# Patient Record
Sex: Male | Born: 1995 | Race: Black or African American | Hispanic: No | Marital: Single
Health system: Midwestern US, Community
[De-identification: ages and names within clinical notes are randomized; demographics above are authoritative.]

## PROBLEM LIST (undated history)

## (undated) DIAGNOSIS — S62307A Unspecified fracture of fifth metacarpal bone, left hand, initial encounter for closed fracture: Secondary | ICD-10-CM

## (undated) DIAGNOSIS — S62327D Displaced fracture of shaft of fifth metacarpal bone, left hand, subsequent encounter for fracture with routine healing: Secondary | ICD-10-CM

---
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
## 2015-06-18 IMAGING — CT CT HEAD W/O CM
1 series · 16 of 30 positions shown, 20 images · non-contrast
Comparison: None.

CLINICAL DATA: Diffuse headache

EXAM:
CT HEAD WITHOUT CONTRAST
TECHNIQUE: Contiguous axial images were obtained from the base of the skull
through the vertex without intravenous contrast.

[Series 2: head 5.0 h30s · axial · 0.45mm/px · z∈[-58,+87]mm · 16 of 33 slices shown, 20 images]
[im 2/33  brain]
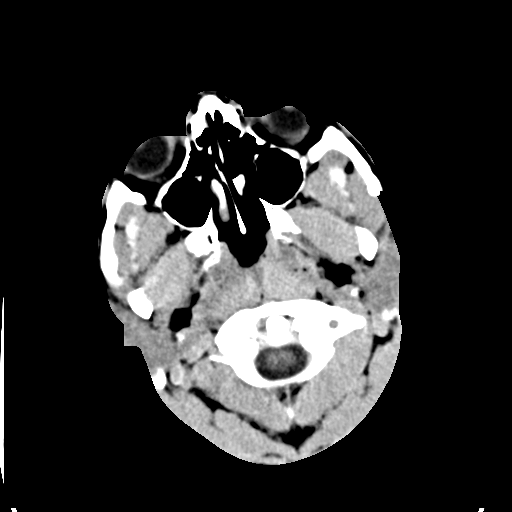
[im 2/33  bone]
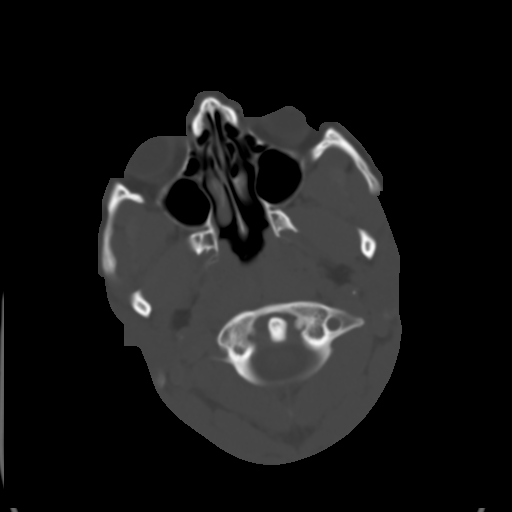
[im 4/33  brain]
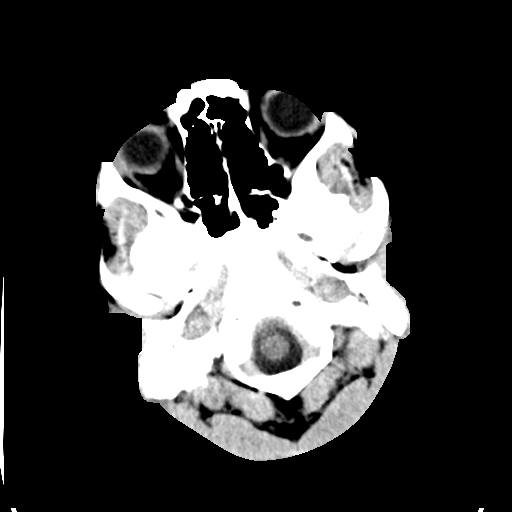
[im 6/33  brain]
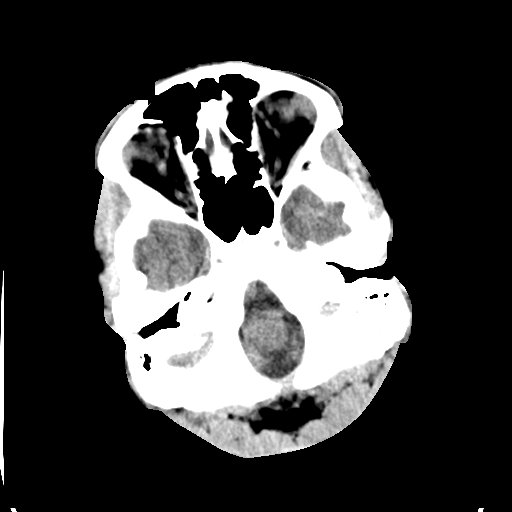
[im 8/33  brain]
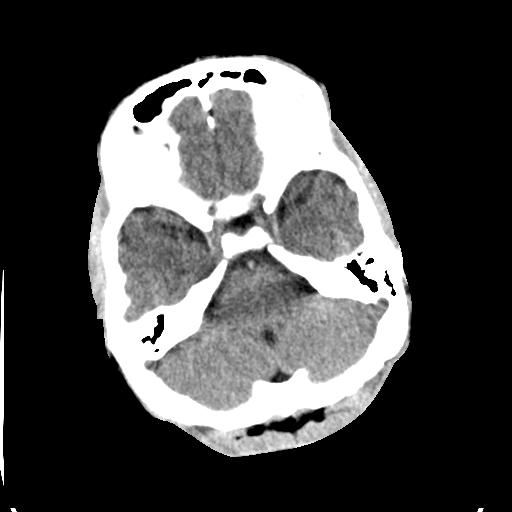
[im 9/33  brain]
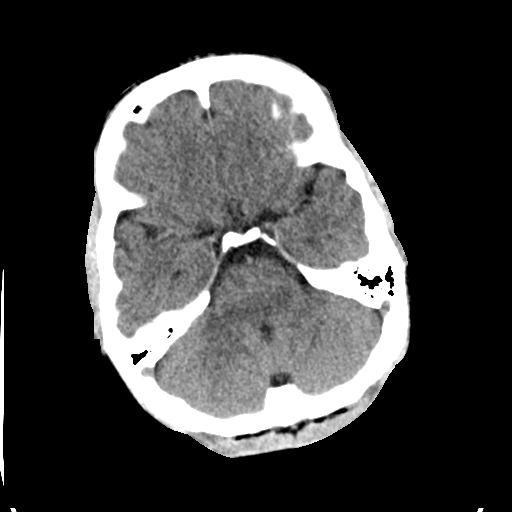
[im 9/33  bone]
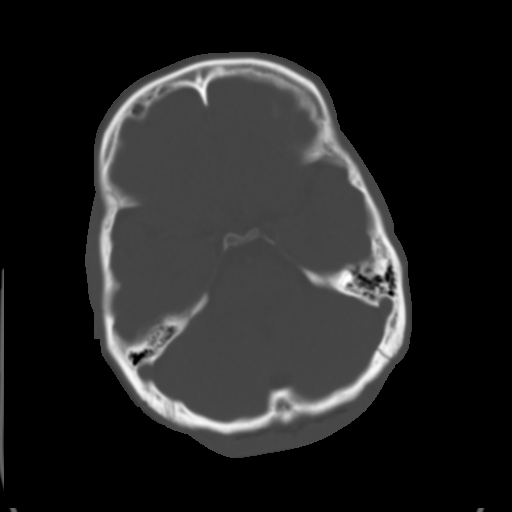
[im 12/33  brain]
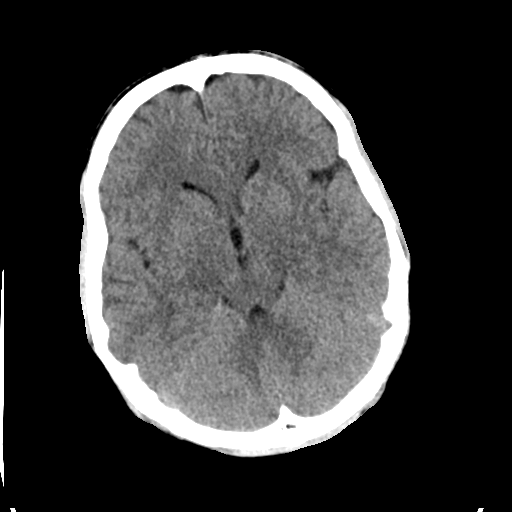
[im 14/33  brain]
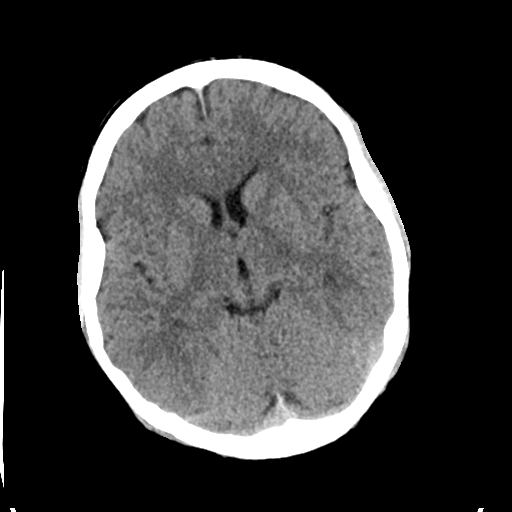
[im 16/33  brain]
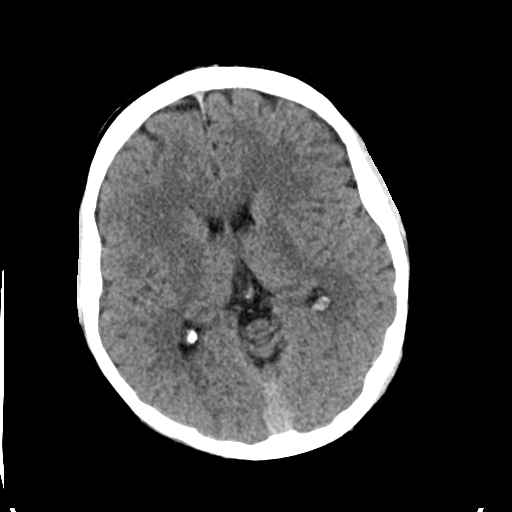
[im 17/33  brain]
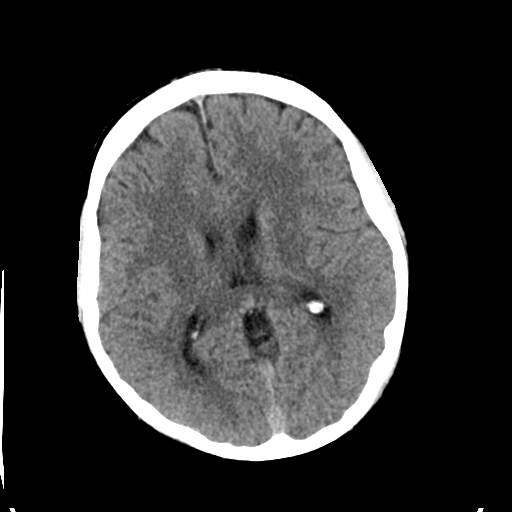
[im 17/33  bone]
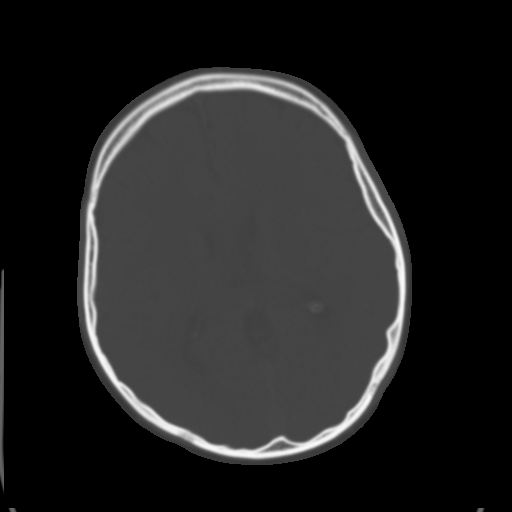
[im 19/33  brain]
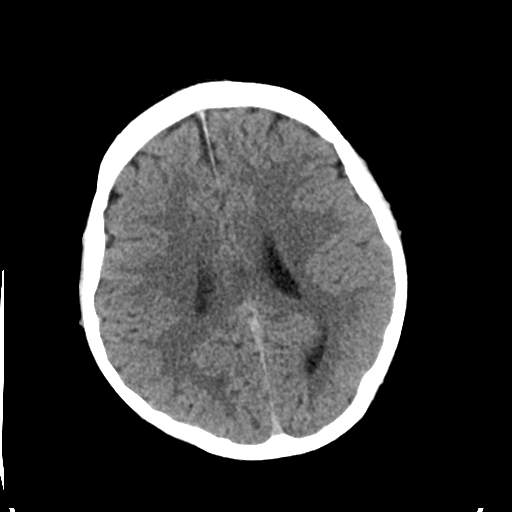
[im 21/33  brain]
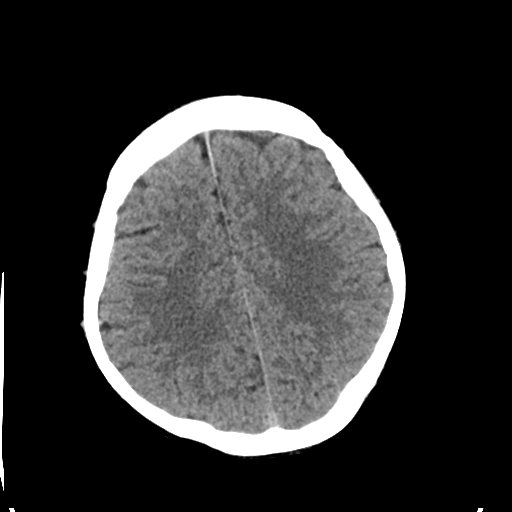
[im 24/33  brain]
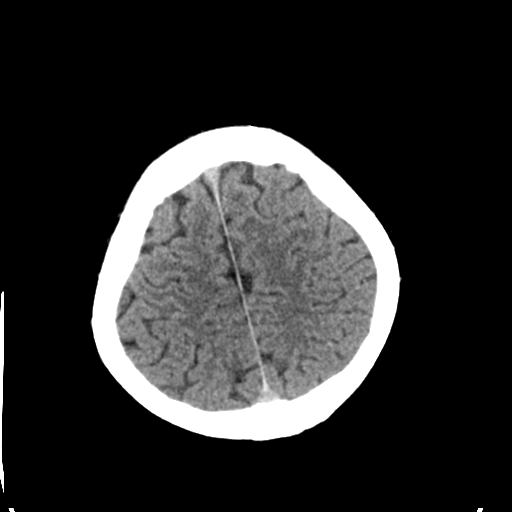
[im 25/33  brain]
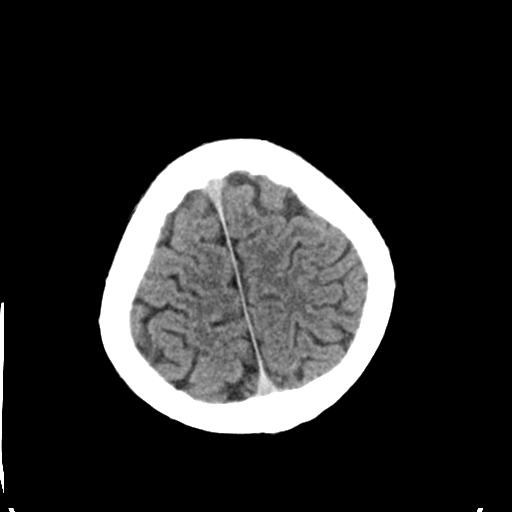
[im 25/33  bone]
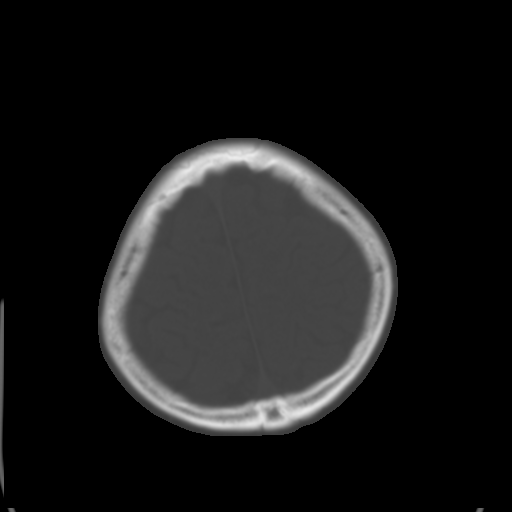
[im 27/33  brain]
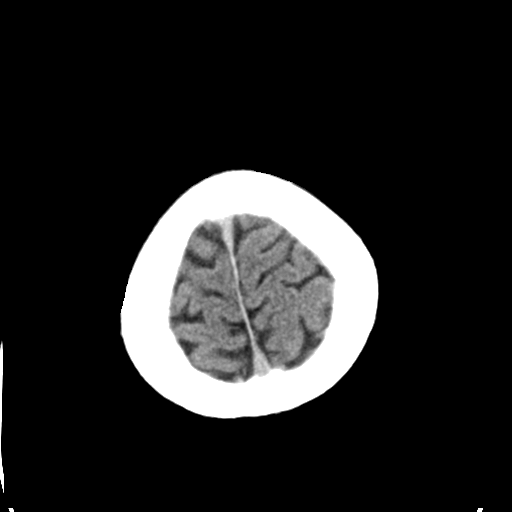
[im 29/33  brain]
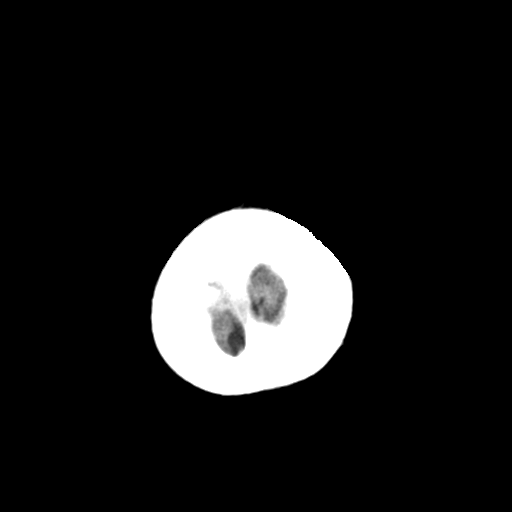
[im 31/33  brain]
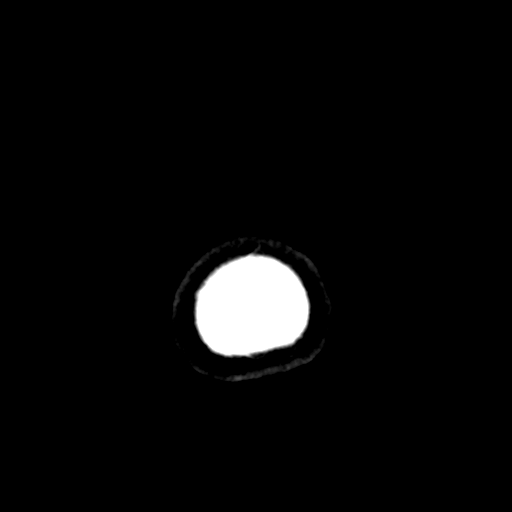

[16 of 30 positions shown; findings below may reference images not displayed]

FINDINGS: No evidence of parenchymal hemorrhage or extra-axial fluid
collection. No mass lesion, mass effect, or midline shift.

No CT evidence of acute infarction.

Cerebral volume is within normal limits.  No ventriculomegaly.

The visualized paranasal sinuses are essentially clear. The mastoid
air cells are unopacified.

No evidence of calvarial fracture.
IMPRESSION: Normal head CT.

## 2015-11-21 NOTE — Nursing Note (Signed)
Nursing Discharge Summary - Text       Nursing Discharge Summary Entered On:  11/21/2015 20:58 EST    Performed On:  11/21/2015 20:57 EST by Barrett, RN, Tresa Endo               DC Information   Discharge To, Anticipated :   Home independently   Devices/Equipment :   Crutches Axillary   Mode of Discharge :   Ambulatory   Transportation :   Private vehicle   Barrett, RNTresa Endo - 11/21/2015 20:57 EST

## 2021-11-30 ENCOUNTER — Emergency Department: Admit: 2021-11-30 | Payer: BLUE CROSS/BLUE SHIELD

## 2021-11-30 ENCOUNTER — Inpatient Hospital Stay
Admit: 2021-11-30 | Discharge: 2021-11-30 | Disposition: A | Discharge status: Home / Self Care | Payer: BLUE CROSS/BLUE SHIELD

## 2021-11-30 DIAGNOSIS — S62339B Displaced fracture of neck of unspecified metacarpal bone, initial encounter for open fracture: Secondary | ICD-10-CM

## 2021-11-30 DIAGNOSIS — S6291XB Unspecified fracture of right wrist and hand, initial encounter for open fracture: Secondary | ICD-10-CM

## 2021-11-30 MED ORDER — LIDOCAINE HCL 1 % IJ SOLN
1 % | INTRAMUSCULAR | Status: AC
Start: 2021-11-30 — End: 2021-11-30
  Administered 2021-11-30: 22:00:00 via INTRADERMAL

## 2021-11-30 MED ORDER — CEPHALEXIN 250 MG PO CAPS
250 MG | ORAL | Status: AC
Start: 2021-11-30 — End: 2021-11-30
  Administered 2021-11-30: 22:00:00 500 via ORAL

## 2021-11-30 MED ORDER — TETANUS-DIPHTH-ACELL PERTUSSIS 5-2.5-18.5 LF-MCG/0.5 IM SUSY
Freq: Once | INTRAMUSCULAR | Status: AC
Start: 2021-11-30 — End: 2021-11-30
  Administered 2021-11-30: 21:00:00 0.5 mL via INTRAMUSCULAR

## 2021-11-30 MED ORDER — CEPHALEXIN 250 MG PO CAPS
250 MG | Freq: Once | ORAL | Status: AC
Start: 2021-11-30 — End: 2021-11-30

## 2021-11-30 MED ORDER — CEPHALEXIN 500 MG PO CAPS
500 MG | ORAL_CAPSULE | Freq: Four times a day (QID) | ORAL | 0 refills | Status: AC
Start: 2021-11-30 — End: 2021-12-07

## 2021-11-30 MED ORDER — HYDROCODONE-ACETAMINOPHEN 5-325 MG PO TABS
5-325 MG | ORAL | Status: AC
Start: 2021-11-30 — End: 2021-11-30
  Administered 2021-11-30: 21:00:00 1 via ORAL

## 2021-11-30 MED ORDER — LIDOCAINE HCL 1 % IJ SOLN
1 % | Freq: Once | INTRAMUSCULAR | Status: AC
Start: 2021-11-30 — End: 2021-11-30

## 2021-11-30 MED FILL — BOOSTRIX 5-2.5-18.5 LF-MCG/0.5 IM SUSY: INTRAMUSCULAR | Qty: 0.5

## 2021-11-30 MED FILL — HYDROCODONE-ACETAMINOPHEN 5-325 MG PO TABS: 5-325 MG | ORAL | Qty: 1

## 2021-11-30 MED FILL — CEPHALEXIN 250 MG PO CAPS: 250 MG | ORAL | Qty: 2

## 2021-11-30 MED FILL — LIDOCAINE HCL 1 % IJ SOLN: 1 % | INTRAMUSCULAR | Qty: 20

## 2021-11-30 NOTE — Discharge Instructions (Addendum)
Take Keflex as prescribed  Wear splint until outpatient follow-up with hand surgeon  Alternate Tylenol and Motrin for pain control  Follow-up with Dr. Cyndia Diver on Wednesday.  Call today for appointment time.  Return to the emergency department immediately for any new or worsening symptoms

## 2021-11-30 NOTE — ED Provider Notes (Signed)
[facility] EMERGENCY DEPT  EMERGENCY DEPARTMENT ENCOUNTER      Pt Name: Jeremy Cantu  MRN: [id number]  Birthdate 09/05/1996  Date of evaluation: 11/30/2021  Provider: Calla KicksAlexandra Lyn Zakyra Kukuk, PA-C    CHIEF COMPLAINT       Chief Complaint   Patient presents with    Hand Injury     C/o right hand pain after hand got shut in car door.          HISTORY OF PRESENT ILLNESS    Jeremy SmilingXavier E Kroner is a 26 y.o. male  who presents to the emergency department for evaluation after slamming hand in car door.  Has swelling to the dorsal aspect of right hand over fifth metacarpal with very small laceration.  Unknown last tetanus immunization.    Nursing Notes were reviewed.    REVIEW OF SYSTEMS     Review of Systems   Constitutional:  Negative for chills and fever.   HENT:  Negative for ear pain and sore throat.    Eyes:  Negative for pain and visual disturbance.   Respiratory:  Negative for cough and shortness of breath.    Cardiovascular:  Negative for chest pain and palpitations.   Gastrointestinal:  Negative for abdominal pain and vomiting.   Genitourinary:  Negative for dysuria and hematuria.   Musculoskeletal:  Negative for back pain.        Right hand swelling   Skin:  Positive for wound (laceration). Negative for rash.   Neurological:  Negative for dizziness and headaches.   Psychiatric/Behavioral:  Negative for behavioral problems and confusion.    All other systems reviewed and are negative.       PAST MEDICAL HISTORY   No past medical history on file.      SURGICAL HISTORY     No past surgical history on file.      CURRENT MEDICATIONS       Previous Medications    No medications on file       ALLERGIES     Patient has no known allergies.    FAMILY HISTORY     No family history on file.       SOCIAL HISTORY       Social History     Socioeconomic History    Marital status: Single         PHYSICAL EXAM       ED Triage Vitals [11/30/21 1516]   BP Temp Temp src Heart Rate Resp SpO2 Height Weight   115/83 -- -- 74 18 100 % 6\' 3"  (1.905 m)  165 lb (74.8 kg)     Physical Exam  Vitals and nursing note reviewed.   Constitutional:       Appearance: Normal appearance.   HENT:      Head: Normocephalic and atraumatic.      Nose: Nose normal.      Mouth/Throat:      Mouth: Mucous membranes are moist.   Eyes:      Conjunctiva/sclera: Conjunctivae normal.   Cardiovascular:      Rate and Rhythm: Normal rate and regular rhythm.   Pulmonary:      Effort: Pulmonary effort is normal.      Breath sounds: Normal breath sounds.   Abdominal:      Palpations: Abdomen is soft.      Tenderness: There is no abdominal tenderness.   Musculoskeletal:      Right hand: Swelling, laceration and tenderness present. Normal pulse.  Comments: Moderate swelling to the dorsal aspect of right hand over fifth metacarpal.  Approximately 0.5 cm to the distal fifth metacarpal.  Slight oozing of blood.  Tenderness over fifth metacarpal.  Range of motion slightly limited secondary to pain.  No tenderness over first through fourth metacarpal. No tenderness to fingers.  Full range of motion of fingers.  2+ radial pulse.  Brisk capillary refill.   Skin:     General: Skin is warm and dry.   Neurological:      General: No focal deficit present.      Mental Status: He is alert and oriented to person, place, and time.   Psychiatric:         Mood and Affect: Mood normal.         Behavior: Behavior normal.        DIAGNOSTIC RESULTS     RADIOLOGY:    XR HAND RIGHT (MIN 3 VIEWS)   Final Result   Mildly comminuted fracture of the distal metadiaphysis of the fifth    metacarpal. This has approximately 20 degrees of volar angulation. No    intra-articular extension noted. Mild overlying soft tissue swelling.      XR HAND RIGHT (MIN 3 VIEWS)    (Results Pending)         EMERGENCY DEPARTMENT COURSE and DIFFERENTIAL DIAGNOSIS/MDM:   Vitals:    Vitals:    11/30/21 1516 11/30/21 1533 11/30/21 1701   BP: 115/83 (!) 140/101 128/62   Pulse: 74 70 61   Resp: 18 18 18    SpO2: 100% 98% 100%   Weight: 74.8 kg      Height: 6\' 3"  (1.905 m)         MDM  Number of Diagnoses or Management Options  Boxer's fracture, open, initial encounter  Diagnosis management comments: Presents with right hand injury after punching a wall  Normal vital signs here in ED  Patient with small laceration to the dorsal aspect of right hand over injury  X-ray confirms mildly comminuted fracture of the distal metadiaphysis of the fifth metacarpal with approximately 20 degrees of volar angulation no intra-articular extension noted.  Laceration repaired.  See procedure note  Tetanus immunization updated  Reduction attempted.  Minimal improvement.  See procedure note.  Will need outpatient follow-up with hand specialist.  Ulnar gutter splint applied.  See procedure note  Stable for outpatient follow-up with hand specialist on Wednesday  We will start on prophylactic antibiotics with Keflex.  First dose given here in ED  Discussed Tylenol & Motrin for pain control  Return to ED precautions given.         Lac Repair    Date/Time: 11/30/2021 4:39 PM  Performed by: Thursday, PA-C  Authorized by: 12/02/2021, PA-C     Consent:     Consent obtained:  Verbal    Consent given by:  Patient    Risks, benefits, and alternatives were discussed: yes      Risks discussed:  Infection, pain, retained foreign body, need for additional repair, poor cosmetic result, poor wound healing, nerve damage, vascular damage and tendon damage    Alternatives discussed:  No treatment  Universal protocol:     Procedure explained and questions answered to patient or proxy's satisfaction: yes      Patient identity confirmed:  Verbally with patient  Anesthesia:     Anesthesia method:  Local infiltration    Local anesthetic:  Lidocaine 1% w/o epi  Laceration details:  Location:  Hand    Hand location:  R hand, dorsum    Length (cm):  0.5  Pre-procedure details:     Preparation:  Patient was prepped and draped in usual sterile fashion and imaging obtained to evaluate  for foreign bodies  Treatment:     Area cleansed with:  Shur-Clens    Amount of cleaning:  Standard    Irrigation solution:  Sterile saline    Irrigation volume:  250    Irrigation method:  Pressure wash  Skin repair:     Repair method:  Sutures    Suture size:  5-0    Suture material:  Prolene    Suture technique:  Simple interrupted    Number of sutures:  1  Approximation:     Approximation:  Close  Repair type:     Repair type:  Simple  Post-procedure details:     Dressing:  Adhesive bandage    Procedure completion:  Tolerated well, no immediate complications  Splint Application    Date/Time: 11/30/2021 4:50 PM  Performed by: Calla Kicks, PA-C  Authorized by: Calla Kicks, PA-C     Consent:     Consent obtained:  Verbal    Consent given by:  Patient    Risks, benefits, and alternatives were discussed: yes      Risks discussed:  Discoloration, pain and swelling    Alternatives discussed:  No treatment  Universal protocol:     Procedure explained and questions answered to patient or proxy's satisfaction: yes      Imaging studies available: yes      Patient identity confirmed:  Verbally with patient  Pre-procedure details:     Distal neurologic exam:  Normal    Distal perfusion: distal pulses strong and brisk capillary refill    Procedure details:     Location:  Hand    Hand location:  R hand    Cast type:  Short arm    Splint type:  Ulnar gutter    Supplies:  Cotton padding, fiberglass and elastic bandage  Post-procedure details:     Distal neurologic exam:  Normal    Distal perfusion: distal pulses strong      Procedure completion:  Tolerated well, no immediate complications    Post-procedure imaging: reviewed    Ortho Injury    Date/Time: 11/30/2021 5:15 PM  Performed by: Calla Kicks, PA-C  Authorized by: Calla Kicks, PA-C   Consent: Verbal consent obtained.  Consent given by: patient  Patient understanding: patient states understanding of the procedure being performed  Patient  consent: the patient's understanding of the procedure matches consent given  Relevant documents: relevant documents present and verified  Test results: test results available and properly labeled  Site marked: the operative site was marked  Imaging studies: imaging studies available  Patient identity confirmed: verbally with patient  Injury location: hand  Location details: right hand  Injury type: fracture  Fracture type: fifth metacarpal  Pre-procedure neurovascular assessment: neurovascularly intact  Pre-procedure distal perfusion: normal  Pre-procedure neurological function: normal  Pre-procedure range of motion: normal  Anesthesia: local infiltration    Anesthesia:  Local anesthesia used: yes  Local Anesthetic: lidocaine 1% with epinephrine    Sedation:  Patient sedated: no    Manipulation performed: yes  Skin traction used: yes  Skeletal traction used: no  Immobilization: splint  Splint type: ulnar gutter  Supplies used: Ortho-Glass  Post-procedure neurovascular assessment: post-procedure neurovascularly intact  Post-procedure distal  perfusion: normal  Post-procedure neurological function: normal  Post-procedure range of motion: normal  Patient tolerance: patient tolerated the procedure well with no immediate complications  Comments: Minimal reduction on xray-- will need outpatient f/u with hand surgery        REASSESSMENT     ED Course as of 11/30/21 1714   Mon Nov 30, 2021   1610 Discussed case with Dr. Cyndia Diver on-call hand surgeon who recommends local numbing, cleaning with Hibiclens, closure of laceration, prophylaxis with Keflex and intrinsic positive splint.  Can see in clinic on Wednesday [AC]   1612 Once again asked patient if he punched something.  He does admit now that he punched a wall.  He states that he did not punch human.  Did discuss with him that there is a significant infection risk if patient punched a human or had contact with tooth/mouth causing laceration.  He once again denies punching  human and states that he did punch a wall. [AC]      ED Course User Index  [AC] Calla Kicks, PA-C         CONSULTS:  IP CONSULT TO HAND SURGERY-- Dr. Cyndia Diver      FINAL IMPRESSION      1. Boxer's fracture, open, initial encounter          DISPOSITION/PLAN   DISPOSITION Decision To Discharge 11/30/2021 04:51:35 PM      PATIENT REFERRED TO:  Wyline Mood, MD  [address]  [place] [address]  [phone]    Schedule an appointment as soon as possible for a visit       DISCHARGE MEDICATIONS:  New Prescriptions    CEPHALEXIN (KEFLEX) 500 MG CAPSULE    Take 1 capsule by mouth 4 times daily for 7 days         (Please note that portions of this note were completed with a voice recognition program.  Efforts were made to edit the dictations but occasionally words are mis-transcribed.)    Calla Kicks, PA-C (electronically signed)  Emergency Physician Assistant           Calla Kicks, PA-C  11/30/21 1721

## 2021-12-02 NOTE — Telephone Encounter (Signed)
PATIENT SEEN AT [facility] ON 11-30-21 FOR RT HAND BOXER'S FX, X-RAYS IN CHART, HE IS REQUESTING A SOONER APPT AT THE [facility]

## 2021-12-04 ENCOUNTER — Ambulatory Visit: Admit: 2021-12-04 | Discharge: 2021-12-04 | Payer: BLUE CROSS/BLUE SHIELD | Attending: Orthopaedic Surgery

## 2021-12-04 DIAGNOSIS — S62307A Unspecified fracture of fifth metacarpal bone, left hand, initial encounter for closed fracture: Secondary | ICD-10-CM

## 2021-12-07 NOTE — Progress Notes (Signed)
Jeremy Cantu was seen today as a new patient evaluation.  This 26 year old right-hand-dominant man injured his right hand when he punched a wall several days ago.  He was seen in the emergency room where a splint was placed.  He complains of right hand pain.    Physical examination    There is mild to moderate swelling on the dorsum of his right hand.  There is ecchymosis noted.  There is a tiny punctate laceration over the dorsal ulnar aspect of his hand with 1 Prolene suture.  There is tenderness over the ulnar side of the hand.  Range of motion in the ring and small fingers are markedly decreased due to pain.  Range of motion in the thumb index and long finger are acceptable.  Sensation is intact and the fingers are well-perfused.  There is no evidence of infection.    His x-rays are reviewed and show a comminuted distal diaphyseal fracture of the small finger metacarpal with a 35 to 40 degree flexion deformity.    Treatment plan    This is a grade 1 open injury which I do think is been treated appropriately however given the severity of the flexion deformity and a fracture proximal to the metacarpal neck I think reduction and pinning will give him a better chance at good functional result.  Today a temporary splint was reapplied.  We will add him to the operating room schedule for early next week.    After discussion of the  treatment options, the decision for surgery is made.    The risks, benefits and indications for surgery including infection, hematoma, wound dehiscence or failure of soft tissue or bone healing, peripheral nerve injury and failure of resolution of the presenting problem were reviewed and permission obtained to proceed with surgery.

## 2021-12-08 ENCOUNTER — Inpatient Hospital Stay: Payer: BLUE CROSS/BLUE SHIELD

## 2021-12-08 MED ORDER — FENTANYL CITRATE (PF) 100 MCG/2ML IJ SOLN
100 MCG/2ML | INTRAMUSCULAR | Status: AC
Start: 2021-12-08 — End: ?

## 2021-12-08 MED ORDER — HYDROCODONE-ACETAMINOPHEN 5-325 MG PO TABS
5-325 MG | ORAL_TABLET | ORAL | 0 refills | Status: AC | PRN
Start: 2021-12-08 — End: 2021-12-11

## 2021-12-08 MED ORDER — HYDROMORPHONE HCL 2 MG/ML IJ SOLN
2 | INTRAMUSCULAR | Status: DC | PRN
Start: 2021-12-08 — End: 2021-12-08

## 2021-12-08 MED ORDER — DEXAMETHASONE SODIUM PHOSPHATE 4 MG/ML IJ SOLN
4 MG/ML | INTRAMUSCULAR | Status: DC | PRN
Start: 2021-12-08 — End: 2021-12-08
  Administered 2021-12-08: 14:00:00 4 via INTRAVENOUS

## 2021-12-08 MED ORDER — SODIUM CHLORIDE 0.9 % IV SOLN
0.9 % | INTRAVENOUS | Status: DC | PRN
Start: 2021-12-08 — End: 2021-12-08

## 2021-12-08 MED ORDER — PROCHLORPERAZINE EDISYLATE 10 MG/2ML IJ SOLN
10 MG/2ML | Freq: Once | INTRAMUSCULAR | Status: DC | PRN
Start: 2021-12-08 — End: 2021-12-08

## 2021-12-08 MED ORDER — LABETALOL HCL 20 MG/4ML IV SOSY
20 MG/4ML | INTRAVENOUS | Status: DC | PRN
Start: 2021-12-08 — End: 2021-12-08

## 2021-12-08 MED ORDER — HYDRALAZINE HCL 20 MG/ML IJ SOLN
20 MG/ML | INTRAMUSCULAR | Status: DC | PRN
Start: 2021-12-08 — End: 2021-12-08

## 2021-12-08 MED ORDER — NORMAL SALINE FLUSH 0.9 % IV SOLN
0.9 % | INTRAVENOUS | Status: DC | PRN
Start: 2021-12-08 — End: 2021-12-08

## 2021-12-08 MED ORDER — MIDAZOLAM HCL 2 MG/2ML IJ SOLN
22 MG/ML | INTRAMUSCULAR | Status: DC | PRN
Start: 2021-12-08 — End: 2021-12-08
  Administered 2021-12-08: 13:00:00 2 via INTRAVENOUS

## 2021-12-08 MED ORDER — NORMAL SALINE FLUSH 0.9 % IV SOLN
0.9 % | Freq: Two times a day (BID) | INTRAVENOUS | Status: DC
Start: 2021-12-08 — End: 2021-12-08

## 2021-12-08 MED ORDER — LACTATED RINGERS IV SOLN
INTRAVENOUS | Status: DC
Start: 2021-12-08 — End: 2021-12-08

## 2021-12-08 MED ORDER — ONDANSETRON HCL 4 MG/2ML IJ SOLN
4 MG/2ML | Freq: Once | INTRAMUSCULAR | Status: DC | PRN
Start: 2021-12-08 — End: 2021-12-08

## 2021-12-08 MED ORDER — LIDOCAINE-EPINEPHRINE 2 %-1:100000 IJ SOLN
2 percent-1:100000 | INTRAMUSCULAR | Status: AC
Start: 2021-12-08 — End: ?

## 2021-12-08 MED ORDER — BUPIVACAINE HCL 0.5 % IJ SOLN (MIXTURES ONLY)
0.5 % | INTRAMUSCULAR | Status: DC | PRN
Start: 2021-12-08 — End: 2021-12-08
  Administered 2021-12-08: 14:00:00 8 via INTRADERMAL

## 2021-12-08 MED ORDER — MIDAZOLAM HCL 2 MG/2ML IJ SOLN
22 MG/ML | INTRAMUSCULAR | Status: AC
Start: 2021-12-08 — End: ?

## 2021-12-08 MED ORDER — ONDANSETRON HCL 4 MG/2ML IJ SOLN
4 MG/2ML | INTRAMUSCULAR | Status: AC
Start: 2021-12-08 — End: ?

## 2021-12-08 MED ORDER — MEPERIDINE HCL 50 MG/ML IJ SOLN
50 MG/ML | INTRAMUSCULAR | Status: DC | PRN
Start: 2021-12-08 — End: 2021-12-08

## 2021-12-08 MED ORDER — IPRATROPIUM-ALBUTEROL 0.5-2.5 (3) MG/3ML IN SOLN
Freq: Once | RESPIRATORY_TRACT | Status: DC | PRN
Start: 2021-12-08 — End: 2021-12-08

## 2021-12-08 MED ORDER — BUPIVACAINE HCL (PF) 0.5 % IJ SOLN
0.5 % | INTRAMUSCULAR | Status: AC
Start: 2021-12-08 — End: ?

## 2021-12-08 MED ORDER — LIDOCAINE HCL 2 % IJ SOLN
2 % | INTRAMUSCULAR | Status: DC | PRN
Start: 2021-12-08 — End: 2021-12-08
  Administered 2021-12-08: 14:00:00 60 via INTRAVENOUS

## 2021-12-08 MED ORDER — PROPOFOL 200 MG/20ML IV EMUL
200 MG/20ML | INTRAVENOUS | Status: AC
Start: 2021-12-08 — End: ?

## 2021-12-08 MED ORDER — PROPOFOL 200 MG/20ML IV EMUL
20020 MG/20ML | INTRAVENOUS | Status: AC
Start: 2021-12-08 — End: ?

## 2021-12-08 MED ORDER — CEFAZOLIN SODIUM-DEXTROSE 2-3 GM-%(50ML) IV SOLR
2000 mg in Dextrose 3% | INTRAVENOUS | Status: AC
Start: 2021-12-08 — End: 2021-12-08
  Administered 2021-12-08: 13:00:00 2000 mg via INTRAVENOUS

## 2021-12-08 MED ORDER — PROPOFOL 200 MG/20ML IV EMUL
200 MG/20ML | INTRAVENOUS | Status: DC | PRN
Start: 2021-12-08 — End: 2021-12-08
  Administered 2021-12-08: 14:00:00 150 via INTRAVENOUS

## 2021-12-08 MED ORDER — ONDANSETRON HCL 4 MG/2ML IJ SOLN
42 MG/2ML | INTRAMUSCULAR | Status: DC | PRN
Start: 2021-12-08 — End: 2021-12-08
  Administered 2021-12-08: 14:00:00 4 via INTRAVENOUS

## 2021-12-08 MED ORDER — LACTATED RINGERS IV SOLN
INTRAVENOUS | Status: DC | PRN
Start: 2021-12-08 — End: 2021-12-08
  Administered 2021-12-08: 13:00:00 via INTRAVENOUS

## 2021-12-08 MED ORDER — FENTANYL CITRATE (PF) 100 MCG/2ML IJ SOLN
100 MCG/2ML | INTRAMUSCULAR | Status: DC | PRN
Start: 2021-12-08 — End: 2021-12-08

## 2021-12-08 MED ORDER — DEXAMETHASONE SODIUM PHOSPHATE 4 MG/ML IJ SOLN
4 MG/ML | INTRAMUSCULAR | Status: AC
Start: 2021-12-08 — End: ?

## 2021-12-08 MED ORDER — FENTANYL CITRATE (PF) 100 MCG/2ML IJ SOLN
100 MCG/2ML | INTRAMUSCULAR | Status: DC | PRN
Start: 2021-12-08 — End: 2021-12-08
  Administered 2021-12-08: 14:00:00 25 via INTRAVENOUS
  Administered 2021-12-08: 13:00:00 50 via INTRAVENOUS
  Administered 2021-12-08: 14:00:00 25 via INTRAVENOUS

## 2021-12-08 MED FILL — DIPRIVAN 200 MG/20ML IV EMUL: 200 MG/20ML | INTRAVENOUS | Qty: 20

## 2021-12-08 MED FILL — FENTANYL CITRATE (PF) 100 MCG/2ML IJ SOLN: 100 MCG/2ML | INTRAMUSCULAR | Qty: 2

## 2021-12-08 MED FILL — ONDANSETRON HCL 4 MG/2ML IJ SOLN: 4 MG/2ML | INTRAMUSCULAR | Qty: 2

## 2021-12-08 MED FILL — PROCHLORPERAZINE EDISYLATE 10 MG/2ML IJ SOLN: 10 MG/2ML | INTRAMUSCULAR | Qty: 2

## 2021-12-08 MED FILL — DEXAMETHASONE SODIUM PHOSPHATE 4 MG/ML IJ SOLN: 4 MG/ML | INTRAMUSCULAR | Qty: 1

## 2021-12-08 MED FILL — XYLOCAINE/EPINEPHRINE 2 %-1:100000 IJ SOLN: 2 %-1:100000 | INTRAMUSCULAR | Qty: 20

## 2021-12-08 MED FILL — CEFAZOLIN SODIUM-DEXTROSE 2-3 GM-%(50ML) IV SOLR: 2000 mg in Dextrose 3% | INTRAVENOUS | Qty: 50

## 2021-12-08 MED FILL — MIDAZOLAM HCL 2 MG/2ML IJ SOLN: 2 MG/ML | INTRAMUSCULAR | Qty: 2

## 2021-12-08 MED FILL — BUPIVACAINE HCL (PF) 0.5 % IJ SOLN: 0.5 % | INTRAMUSCULAR | Qty: 30
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
## 2021-12-08 NOTE — Discharge Instructions (Addendum)
PATIENT INSTRUCTIONS AFTER SURGERY    KEEP YOUR HAND ELEVATED. Surgery always results in swelling. Most of the pain and stiffness right after surgery is due to internal pressure from swelling. To relieve the pressure, raise your hand higher than your heart to drain fluid out of your hand. For at least three days after surgery, keep your hand up as much as possible - day and night. Elevate your arm on pillows - especially when you sleep.  Swelling may also make the cast or bandage tight, which will cause more pain and swelling. If you feel that your cast or bandage is too tight, please contact me - I would rather change it than for you to have a problem  Keep your fingers moving.  Try to fully straighten your  fingers and make a fist.  Motion is good!  Avoid strong grasp or heavy lifting.    KEEP YOUR BANDAGE DRY. Wounds heal with the fewest problems if they are kept clean and dry. When bathing, protective bandage and a plastic bag. If your bandage gets wet on the inside, it should be changed, not simply allowed to dry.  Do not remove your dressing.  It is intended to protect the area of surgery and should only be removed by your physician.    DIET:    Regular as tolerated    BRUISING OR BLEEDING is common after surgery. Bandages often become stained with blood on the day of surgery. Bruising often worsens several days after surgery. Bruising or bleeding is usually not a source of concern unless accompanied by steady drainage, worsening pain, or progressive swelling.    SMOKING interferes with healing and makes painful problems more painful. Problems after surgery are more likely if you smoke, especially if you keep smoking after surgery. DON???T SMOKE!    INFECTION should be suspected if there is redness, pain or swelling that gets worse over the course of the day or night, despite elevating the hand. Infection is uncommon less than 4 days after surgery or more than 2 weeks after surgery. Please contact me if you have  any questions about infection.    FOLLOW UP.  Sutures will  normally be removed at 10-14 days after surgery.  Call our office at [phone] to confirm your appointment time if you do not have an appointment already scheduled.  Take medications as tolerated.    Under some circumstances we may wish to see you sooner  for a wound check or to initiate early occupational therapy.  Your physician will advise you of this is necessary and will make the appropriate arrangements.  PLEASE CALL ME IF YOU HAVE ANY QUESTIONS OR PROBLEMS.           General Information:    -You may experience lightheadedness, forgetfulness, dizziness, sleepiness, headache, nausea, or sore throat following surgery.     -For any emergencies call 911    -Your reflexes will be diminished after receiving anesthetic drugs     Do not drive or operative heavy machinery for 24 hrs   Do not drink any alcohol or smoke for 24 hrs   Avoid making any important decision for 24 hrs   Do not stay alone for the next 24 hrs    Diet/Fluids    -Start off with a light first meal (no greasy or spicy food)    Activity    - You are advised to go home and restrict your activities for the rest of the day  Medications    -If you develop a fever (over 101*), chills, active bleeding, excessive swelling or nausea and vomiting past the 24hr period call your doctor.     Follow up care: Call the office if you don't already have an appointment scheduled.

## 2021-12-08 NOTE — Op Note (Signed)
Operative Note      Patient: Jeremy Cantu  Date of Birth: 02-25-1996  MRN: [id number]    Date of Procedure: 12/08/2021    Pre-Op Diagnosis: Closed displaced fracture of fifth metacarpal bone of left hand [S62.307A]    Post-Op Diagnosis: Same       Procedure(s):  CRPP vs ORIF RIGHT SMALL METACARPAL FX  FINGER OPEN REDUCTION INTERNAL FIXATION    Surgeon(s):  Wyline Mood, MD    Assistant:   * No surgical staff found *    Anesthesia: Monitor Anesthesia Care    Estimated Blood Loss (mL): Minimal    Complications: None    Specimens:   * No specimens in log *    Implants:  Implant Name Type Inv. Item Serial No. Manufacturer Lot No. LRB No. Used Action   K WIRE FIX L6IN DIA0.045IN 1600645] [facility] [employer]] - [id number]  K WIRE FIX L6IN DIA0.045IN 1600645] [employer]]  [facility] [employer]WD [id number] Right 2 Implanted         Drains: * No LDAs found *    Findings:     Detailed Description of Procedure:   This man has a comminuted distal diaphyseal fracture with angulation of the small finger metacarpal of his right hand.  He is brought to the operating room for closed reduction or if necessary open reduction and internal fixation..    The patient was placed on the operating table in the supine position.  A well-padded pneumatic tourniquet was applied to the right upper arm.  A solution of Xylocaine and Marcaine was used to infiltrate a field block around the small finger metacarpal fracture.  The hand was then scrubbed with Hibiclens, prepped with ChloraPrep and draped sterilely.  A single suture which had previously been paced over the dorsum of the hand was removed.      Under fluoroscopic guidance a closed reduction maneuver was performed.  This was a comminuted fracture of the distal diaphysis and proximal portion of the distal metaphysis of the small finger metacarpal with about a 40 degree angulation deformity.  I was able to correct this to 0 degrees  with about 1/2-1 cortical thickness displacement in a palmar direction.  While holding this reduced two, 0.45 inch Kirschner wires were driven in a retrograde fashion from the metacarpal head fragment, down the end osseous canal and engaging the proximal metaphysis.  This gave a stable construct with near-anatomic reduction and no rotational deformity.  Pins were trimmed to the appropriate length.  Sterile occlusive dressings and a well-padded palmarly based intrinsic plus splint was placed.  There were no operative complications and he was transferred to recovery room the end of the case in good condition he was    Electronically signed by Wyline Mood, MD on 12/08/2021 at 8:55 AM

## 2021-12-08 NOTE — Anesthesia Post-Procedure Evaluation (Signed)
Department of Anesthesiology  Postprocedure Note    Patient: Jeremy Cantu  MRN: [id number]  Birthdate: July 08, 1996  Date of evaluation: 12/08/2021      Procedure Summary     Date: 12/08/21 Room / Location: [facility] OR 02 / [facility]    Anesthesia Start: 0829 Anesthesia Stop: 3785    Procedure: CRPP RIGHT SMALL METACARPAL FX (Right: Fingers) Diagnosis:       Closed displaced fracture of fifth metacarpal bone of left hand      (Closed displaced fracture of fifth metacarpal bone of left hand [S62.307A])    Surgeons: Gevena Mart, MD Responsible Provider: Joyce Copa, MD    Anesthesia Type: MAC ASA Status: 1          Anesthesia Type: MAC    Aldrete Phase I:      Aldrete Phase II:        Anesthesia Post Evaluation    Patient location during evaluation: PACU  Level of consciousness: sleepy but conscious  Airway patency: patent  Nausea & Vomiting: no nausea and no vomiting  Complications: no  Cardiovascular status: hemodynamically stable  Respiratory status: acceptable, nonlabored ventilation and spontaneous ventilation  Hydration status: euvolemic  Comments: Vital signs in PACU reviewed by anesthesia provider and documented in Handoff.  Multimodal analgesia pain management approach

## 2021-12-08 NOTE — Anesthesia Pre-Procedure Evaluation (Signed)
Department of Anesthesiology  Preprocedure Note       Name:  Jeremy Cantu   Age:  26 y.o.  DOB:  Feb 29, 1996                                          MRN:  [id number]         Date:  12/08/2021      Surgeon: Juliann Mule):  Elfredia Nevins II, MD    Procedure: Procedure(s):  CRPP vs ORIF RIGHT SMALL METACARPAL FX  FINGER OPEN REDUCTION INTERNAL FIXATION    Medications prior to admission:   Prior to Admission medications    Not on File       Current medications:    No current facility-administered medications for this encounter.     No current outpatient medications on file.       Allergies:  No Known Allergies    Problem List:    Patient Active Problem List   Diagnosis Code   ??? Closed displaced fracture of fifth metacarpal bone of left hand S62.307A       Past Medical History:  No past medical history on file.    Past Surgical History:  No past surgical history on file.    Social History:    Social History     Tobacco Use   ??? Smoking status: Never   ??? Smokeless tobacco: Never   Substance Use Topics   ??? Alcohol use: Yes     Comment: once a week                                Counseling given: Not Answered      Vital Signs (Current): There were no vitals filed for this visit.                                           BP Readings from Last 3 Encounters:   11/30/21 128/62       NPO Status:                                                                                 BMI:   Wt Readings from Last 3 Encounters:   12/04/21 165 lb (74.8 kg)   11/30/21 165 lb (74.8 kg)     There is no height or weight on file to calculate BMI.    CBC: No results found for: WBC, RBC, HGB, HCT, MCV, RDW, PLT    CMP: No results found for: NA, K, CL, CO2, BUN, CREATININE, GFRAA, AGRATIO, LABGLOM, GLUCOSE, GLU, PROT, CALCIUM, BILITOT, ALKPHOS, AST, ALT    POC Tests: No results for input(s): POCGLU, POCNA, POCK, POCCL, POCBUN, POCHEMO, POCHCT in the last 72 hours.    Coags: No results found for: PROTIME, INR, APTT    HCG (If Applicable): No results  found for: PREGTESTUR, PREGSERUM, HCG, HCGQUANT     ABGs: No  results found for: PHART, PO2ART, PCO2ART, HCO3ART, BEART, O2SATART     Type & Screen (If Applicable):  No results found for: LABABO, LABRH    Drug/Infectious Status (If Applicable):  No results found for: HIV, HEPCAB    COVID-19 Screening (If Applicable): No results found for: COVID19        Anesthesia Evaluation  Patient summary reviewed and Nursing notes reviewed  Airway: Mallampati: II  TM distance: <3 FB   Neck ROM: full  Mouth opening: > = 3 FB   Dental:          Pulmonary:normal exam                               Cardiovascular:            Rhythm: regular  Rate: normal                    Neuro/Psych:               GI/Hepatic/Renal:             Endo/Other:                     Abdominal:             Vascular:          Other Findings: Patient awake, alert, and oriented. No gross neurological deficits appreciated. Focused physical exam as above.           Anesthesia Plan      MAC     ASA 1       Induction: intravenous.    MIPS: Postoperative opioids intended and Prophylactic antiemetics administered.  Anesthetic plan and risks discussed with patient.        Attending anesthesiologist reviewed and agrees with Preprocedure content                Joyce Copa, MD   12/08/2021

## 2021-12-18 ENCOUNTER — Ambulatory Visit: Admit: 2021-12-18 | Discharge: 2021-12-18 | Payer: BLUE CROSS/BLUE SHIELD | Attending: Orthopaedic Surgery

## 2021-12-18 ENCOUNTER — Ambulatory Visit: Admit: 2021-12-18 | Discharge: 2021-12-18 | Payer: BLUE CROSS/BLUE SHIELD

## 2021-12-18 DIAGNOSIS — M79641 Pain in right hand: Secondary | ICD-10-CM

## 2021-12-18 NOTE — Progress Notes (Signed)
ORTHOPEDIC PROGRESS NOTE  Hand and Upper Extremity Surgery  [facility]      CHIEF COMPLAINT  Chief Complaint   Patient presents with    Follow-up     Post op evaluation, R SF [facility] Fx open fixation with K wires. Date of surgery was 12/08/21.   Pt says he is doing a lot better since surgery. Can move fingers and has no pain.         HISTORY  Jeremy Cantu is a 26 y.o. male seen for a post op evaluation, R SF MC Fx open fixation with K wires. Date of surgery was 12/08/21. Denies pain or numbness.        PHYSICAL EXAM  General: no acute distress, alert and oriented  Skin: color and condition of the skin and fingernails is normal  Neurological: sensation intact to median, ulnar and radial nerves  Cardiovascular: brisk cap refill  Hand: Pin sites are clean, dry intact. SILT UDN and RDN. Brisk cap refill.    Assessment    ICD-10-CM    1. Right hand pain  M79.641 XR HAND RIGHT (MIN 3 VIEWS)          PLAN:  XR reviewed with patient. He is doing well overall. Pin sites are clean and was instructed on pin care. Counseled on signs of infection and told to call the office immediately. Sent to OT for custom fit orthosis. Will see him back in 3 weeks.

## 2022-01-08 ENCOUNTER — Encounter: Attending: Orthopaedic Surgery | Primary: Internal Medicine

## 2022-01-08 DIAGNOSIS — M79645 Pain in left finger(s): Secondary | ICD-10-CM

## 2022-01-12 ENCOUNTER — Ambulatory Visit
Admit: 2022-01-12 | Discharge: 2022-01-12 | Payer: BLUE CROSS/BLUE SHIELD | Attending: Orthopaedic Surgery | Primary: Internal Medicine

## 2022-01-12 ENCOUNTER — Ambulatory Visit: Admit: 2022-01-12 | Discharge: 2022-01-12 | Payer: BLUE CROSS/BLUE SHIELD | Primary: Internal Medicine

## 2022-01-12 DIAGNOSIS — M79644 Pain in right finger(s): Secondary | ICD-10-CM

## 2022-01-12 NOTE — Progress Notes (Signed)
Jeremy Cantu was seen in follow-up for pinning of a metacarpal fracture.  He is doing well.  He is not complaining much of pain.    Physical examination    The pin sites are clean.  There is no tenderness over the fracture site.  Range of motion is of course decreased as would be expected.  There is no rotational deformity.  His fingers are well-perfused and normally sensate.    XR FINGER RIGHT (MIN 2 VIEWS)  PA and lateral x-ray examination of the right small finger shows 2   Kirschner wires transfixing a nondisplaced fracture of the small finger   metacarpal.  Callus formation is seen.       Treatment plan    The pins were pulled today.  He can discontinue splinting.  He is referred to occupational therapy for assistance with range of motion exercises and strengthening.  I will plan to see him back in the 4 to 6-week timeframe

## 2023-01-14 NOTE — Telephone Encounter (Signed)
MyChart activation project/patient mailbox is full.
